# Patient Record
Sex: Female | Born: 2000 | Race: Black or African American | Hispanic: No | Marital: Single | Smoking: Never smoker
Health system: Southern US, Community
[De-identification: ages and names within clinical notes are randomized; demographics above are authoritative.]

## PROBLEM LIST (undated history)

## (undated) DIAGNOSIS — A1801 Tuberculosis of spine: Secondary | ICD-10-CM

## (undated) DIAGNOSIS — F419 Anxiety disorder, unspecified: Secondary | ICD-10-CM

## (undated) DIAGNOSIS — F32A Depression, unspecified: Secondary | ICD-10-CM

---

## 2021-06-04 ENCOUNTER — Encounter (HOSPITAL_COMMUNITY): Payer: Self-pay | Admitting: Emergency Medicine

## 2021-06-04 ENCOUNTER — Ambulatory Visit (HOSPITAL_COMMUNITY)
Admission: EM | Admit: 2021-06-04 | Discharge: 2021-06-04 | Disposition: A | Discharge status: Home / Self Care | Payer: BC Managed Care – PPO | Attending: Family Medicine | Admitting: Family Medicine

## 2021-06-04 ENCOUNTER — Other Ambulatory Visit: Payer: Self-pay

## 2021-06-04 DIAGNOSIS — J0391 Acute recurrent tonsillitis, unspecified: Secondary | ICD-10-CM | POA: Insufficient documentation

## 2021-06-04 DIAGNOSIS — Z113 Encounter for screening for infections with a predominantly sexual mode of transmission: Secondary | ICD-10-CM | POA: Diagnosis not present

## 2021-06-04 HISTORY — DX: Tuberculosis of spine: A18.01

## 2021-06-04 HISTORY — DX: Depression, unspecified: F32.A

## 2021-06-04 HISTORY — DX: Anxiety disorder, unspecified: F41.9

## 2021-06-04 LAB — POCT RAPID STREP A, ED / UC: Streptococcus, Group A Screen (Direct): NEGATIVE

## 2021-06-04 MED ORDER — PREDNISONE 20 MG PO TABS: 20.0000 mg | ORAL_TABLET | Freq: Every day | ORAL | 0 refills | Status: AC

## 2021-06-04 MED ORDER — LIDOCAINE HCL (PF) 1 % IJ SOLN
INTRAMUSCULAR | Status: AC
  Filled 2021-06-04: qty 2

## 2021-06-04 MED ORDER — CEFTRIAXONE SODIUM 500 MG IJ SOLR
INTRAMUSCULAR | Status: AC
  Filled 2021-06-04: qty 500

## 2021-06-04 MED ORDER — DOXYCYCLINE HYCLATE 100 MG PO CAPS: 100.0000 mg | ORAL_CAPSULE | Freq: Two times a day (BID) | ORAL | 0 refills | Status: AC

## 2021-06-04 MED ORDER — CEFTRIAXONE SODIUM 500 MG IJ SOLR
500.0000 mg | Freq: Once | INTRAMUSCULAR | Status: AC
  Administered 2021-06-04: 500 mg via INTRAMUSCULAR

## 2021-06-04 NOTE — ED Provider Notes (Signed)
[facility]    CSN: [id number] Arrival date & time: 06/04/21  1609      History   Chief Complaint No chief complaint on file.   HPI Tina Holt is a 20 y.o. female.   HPI Patient presents today for evaluation of recurrent tonsillitis.  She has been treated at her [facility] twice since August with amoxicillin.  She reports she has been tested for mono, rapid strep, and reports a different test however she cannot recall the name of the test.  She is sexually active including oral sex.  She reports this is never happened previously and she has no history of recurrent strep infections.  Endorses difficulty swallowing due to the enlargement of her tonsils.  She has had no fever.  Current symptoms have been present for greater then 2 weeks. Past Medical History:  Diagnosis Date   Anxiety and depression    Pott's disease     There are no problems to display for this patient.   History reviewed. No pertinent surgical history.  OB History   No obstetric history on file.      Home Medications    Prior to Admission medications   Medication Sig Start Date End Date Taking? Authorizing Provider  doxycycline (VIBRAMYCIN) 100 MG capsule Take 1 capsule (100 mg total) by mouth 2 (two) times daily for 7 days. 06/04/21 06/11/21 Yes Bing Neighbors, FNP  predniSONE (DELTASONE) 20 MG tablet Take 1 tablet (20 mg total) by mouth daily with breakfast for 5 days. 06/04/21 06/09/21 Yes Bing Neighbors, FNP    Family History No family history on file.  Social History Social History   Tobacco Use   Smoking status: Never   Smokeless tobacco: Never  Vaping Use   Vaping Use: Never used  Substance Use Topics   Alcohol use: Yes    Comment: rarely     Allergies   Patient has no known allergies.   Review of Systems Review of Systems Pertinent negatives listed in HPI   Physical Exam Triage Vital Signs ED Triage Vitals  Enc  Vitals Group     BP 06/04/21 1703 133/81     Pulse Rate 06/04/21 1703 (!) 58     Resp 06/04/21 1703 17     Temp 06/04/21 1703 97.9 F (36.6 C)     Temp Source 06/04/21 1703 Oral     SpO2 06/04/21 1703 99 %     Weight --      Height --      Head Circumference --      Peak Flow --      Pain Score 06/04/21 1708 0     Pain Loc --      Pain Edu? --      Excl. in GC? --    No data found.  Updated Vital Signs BP 126/70   Pulse 60   Temp 98.5 F (36.9 C)   Resp 18   LMP 05/21/2021   SpO2 100%   Visual Acuity Right Eye Distance:   Left Eye Distance:   Bilateral Distance:    Right Eye Near:   Left Eye Near:    Bilateral Near:     Physical Exam Constitutional:      General: She is not in acute distress.    Appearance: She is not ill-appearing.  HENT:     Head: Normocephalic and atraumatic.     Mouth/Throat:     Mouth: Mucous membranes are  moist.     Pharynx: Posterior oropharyngeal erythema and uvula swelling present.     Tonsils: Tonsillar exudate present. No tonsillar abscesses. 4+ on the right. 4+ on the left.  Eyes:     Pupils: Pupils are equal, round, and reactive to light.  Cardiovascular:     Rate and Rhythm: Normal rate.  Pulmonary:     Effort: Pulmonary effort is normal.     Breath sounds: Normal breath sounds.  Skin:    Capillary Refill: Capillary refill takes less than 2 seconds.  Neurological:     General: No focal deficit present.     Mental Status: She is alert and oriented to person, place, and time.  Psychiatric:        Mood and Affect: Mood normal.        Thought Content: Thought content normal.        Judgment: Judgment normal.     UC Treatments / Results  Labs (all labs ordered are listed, but only abnormal results are displayed) Labs Reviewed  CULTURE, GROUP A STREP [facility])  POCT RAPID STREP A, ED / UC  CYTOLOGY, (ORAL, ANAL, URETHRAL) ANCILLARY ONLY    EKG   Radiology No results found.  Procedures Procedures (including  critical care time)  Medications Ordered in UC Medications  cefTRIAXone (ROCEPHIN) injection 500 mg (500 mg Intramuscular Given 06/04/21 1828)    Initial Impression / Assessment and Plan / UC Course  I have reviewed the triage vital signs and the nursing notes.  Pertinent labs & imaging results that were available during my care of the patient were reviewed by me and considered in my medical decision making (see chart for details).    Rapid strep is negative Given appearance of tonsils and throat along with previous failure of amoxicillin I am treating and covering for possible GC chlamydia of the throat. Rocephin 500 given here in clinic.  Patient will continue treatment at home with doxycycline 500 mg twice daily for 7 days.  For tonsillar swelling prednisone 20 mg once daily with breakfast for 5 days.  Patient also given information to follow-up with ENT if her symptoms do not readily improve following 3 days of treatment. Patient verbalized understanding and agreement with plan. Final Clinical Impressions(s) / UC Diagnoses   Final diagnoses:  Recurrent tonsillitis     Discharge Instructions      You received a Rocephin injection today here in clinic this covers for STDs including gonorrhea and some complicated cases of acute tonsillitis.  We will also start you on doxycycline for treatment of chlamydia while awaiting your results of the throat swabs that I collected here in clinic today.  Also start prednisone 20 mg once daily with breakfast only take in the morning time as this can cause problems with sleep this will help reduce the enlargement and pain which is caused by inflammation of your tonsils.  In the meantime if your symptoms do not readily improve within 3 days please contact the ENT specialist listed on your discharge paperwork.     ED Prescriptions     Medication Sig Dispense Auth. Provider   doxycycline (VIBRAMYCIN) 100 MG capsule Take 1 capsule (100 mg total) by  mouth 2 (two) times daily for 7 days. 14 capsule Bing Neighbors, FNP   predniSONE (DELTASONE) 20 MG tablet Take 1 tablet (20 mg total) by mouth daily with breakfast for 5 days. 5 tablet Bing Neighbors, FNP      PDMP not reviewed this  encounter.   Bing Neighbors, FNP 06/04/21 Paulo Fruit

## 2021-06-04 NOTE — ED Triage Notes (Signed)
Pt is present today with tonsil discomfort. Pt states that she noticed white patches in her mouth.

## 2021-06-04 NOTE — Discharge Instructions (Addendum)
You received a Rocephin injection today here in clinic this covers for STDs including gonorrhea and some complicated cases of acute tonsillitis.  We will also start you on doxycycline for treatment of chlamydia while awaiting your results of the throat swabs that I collected here in clinic today.  Also start prednisone 20 mg once daily with breakfast only take in the morning time as this can cause problems with sleep this will help reduce the enlargement and pain which is caused by inflammation of your tonsils.  In the meantime if your symptoms do not readily improve within 3 days please contact the ENT specialist listed on your discharge paperwork.

## 2021-06-05 LAB — CYTOLOGY, (ORAL, ANAL, URETHRAL) ANCILLARY ONLY
Chlamydia: NEGATIVE
Comment: NEGATIVE
Comment: NEGATIVE
Comment: NORMAL
Neisseria Gonorrhea: NEGATIVE
Trichomonas: NEGATIVE

## 2021-06-07 LAB — CULTURE, GROUP A STREP (THRC)

## 2021-08-11 ENCOUNTER — Other Ambulatory Visit: Payer: Self-pay

## 2021-08-11 ENCOUNTER — Ambulatory Visit (HOSPITAL_COMMUNITY)
Admission: EM | Admit: 2021-08-11 | Discharge: 2021-08-11 | Disposition: A | Discharge status: Home / Self Care | Payer: BC Managed Care – PPO | Attending: Emergency Medicine | Admitting: Emergency Medicine

## 2021-08-11 ENCOUNTER — Encounter (HOSPITAL_COMMUNITY): Payer: Self-pay | Admitting: Emergency Medicine

## 2021-08-11 DIAGNOSIS — J040 Acute laryngitis: Secondary | ICD-10-CM | POA: Diagnosis not present

## 2021-08-11 DIAGNOSIS — R509 Fever, unspecified: Secondary | ICD-10-CM | POA: Diagnosis not present

## 2021-08-11 DIAGNOSIS — R590 Localized enlarged lymph nodes: Secondary | ICD-10-CM | POA: Insufficient documentation

## 2021-08-11 DIAGNOSIS — R10817 Generalized abdominal tenderness: Secondary | ICD-10-CM | POA: Diagnosis not present

## 2021-08-11 DIAGNOSIS — J029 Acute pharyngitis, unspecified: Secondary | ICD-10-CM | POA: Insufficient documentation

## 2021-08-11 DIAGNOSIS — R52 Pain, unspecified: Secondary | ICD-10-CM | POA: Insufficient documentation

## 2021-08-11 LAB — RESPIRATORY PANEL BY PCR

## 2021-08-11 LAB — POCT RAPID STREP A, ED / UC: Streptococcus, Group A Screen (Direct): NEGATIVE

## 2021-08-11 LAB — POCT INFECTIOUS MONO SCREEN, ED / UC: Mono Screen: NEGATIVE

## 2021-08-11 MED ORDER — PENICILLIN G BENZATHINE 1200000 UNIT/2ML IM SUSY
PREFILLED_SYRINGE | INTRAMUSCULAR | Status: AC
  Filled 2021-08-11: qty 2

## 2021-08-11 MED ORDER — PENICILLIN G BENZATHINE 1200000 UNIT/2ML IM SUSY
2.4000 10*6.[IU] | PREFILLED_SYRINGE | Freq: Once | INTRAMUSCULAR | Status: AC
  Administered 2021-08-11: 2.4 10*6.[IU] via INTRAMUSCULAR

## 2021-08-11 MED ORDER — FLUCONAZOLE 150 MG PO TABS: ORAL_TABLET | ORAL | 0 refills | Status: AC

## 2021-08-11 MED ORDER — IBUPROFEN 800 MG PO TABS
800.0000 mg | ORAL_TABLET | Freq: Once | ORAL | Status: AC
  Administered 2021-08-11: 800 mg via ORAL

## 2021-08-11 MED ORDER — ONDANSETRON 8 MG PO TBDP: 8.0000 mg | ORAL_TABLET | Freq: Three times a day (TID) | ORAL | 1 refills | Status: AC | PRN

## 2021-08-11 MED ORDER — LIDOCAINE VISCOUS HCL 2 % MT SOLN
OROMUCOSAL | Status: AC
  Filled 2021-08-11: qty 15

## 2021-08-11 MED ORDER — LIDOCAINE VISCOUS HCL 2 % MT SOLN: 15.0000 mL | OROMUCOSAL | 0 refills | Status: AC | PRN

## 2021-08-11 MED ORDER — LIDOCAINE VISCOUS HCL 2 % MT SOLN
15.0000 mL | Freq: Once | OROMUCOSAL | Status: AC
  Administered 2021-08-11: 15 mL via OROMUCOSAL

## 2021-08-11 MED ORDER — AMOXICILLIN-POT CLAVULANATE 875-125 MG PO TABS
1.0000 | ORAL_TABLET | Freq: Two times a day (BID) | ORAL | 0 refills | Status: AC
Start: 2021-08-11 — End: 2021-08-25

## 2021-08-11 MED ORDER — IBUPROFEN 800 MG PO TABS
ORAL_TABLET | ORAL | Status: AC
  Filled 2021-08-11: qty 1

## 2021-08-11 MED ORDER — CLINDAMYCIN HCL 150 MG PO CAPS: 450.0000 mg | ORAL_CAPSULE | Freq: Three times a day (TID) | ORAL | 0 refills | Status: AC

## 2021-08-11 MED ORDER — METHYLPREDNISOLONE 4 MG PO TBPK: ORAL_TABLET | ORAL | 0 refills | Status: AC

## 2021-08-11 MED ORDER — LOPERAMIDE HCL 2 MG PO TABS: 4.0000 mg | ORAL_TABLET | Freq: Four times a day (QID) | ORAL | 1 refills | Status: AC | PRN

## 2021-08-11 NOTE — ED Triage Notes (Signed)
Pt held up her phone with message typed out that states had sore throat and swollen tonsils for over month and now not able to swallow or speak. Feels dizzy because not able to eat and drink. Reports has been seen before and given medications without relief.

## 2021-08-11 NOTE — ED Provider Notes (Signed)
[facility]    CSN: [id number] Arrival date & time: 08/11/21  1011    HISTORY   Chief Complaint  Patient presents with   Sore Throat   HPI Tina Holt is a 21 y.o. female. Patient presents to the urgent care today with third occurrence of acute tonsillitis and pharyngitis since September 2022.  Patient states she also now has laryngitis as well.  Patient was initially seen in September, treated with amoxicillin and had some improvement of symptoms however symptoms returned about a week after she finished treatment.  Patient was seen again at urgent care in November, at that time she was provided with an injection of ceftriaxone and a prescription for doxycycline 100 mg twice daily for 7 days.  Patient was tested during that visit for mono, strep, and oral cytology was performed to evaluate for STD, all results were negative.  Patient states that after treatment November again she had relief for about 2 weeks, then slowly her symptoms began to return again.  Patient is tearful during visit today, states she is very frustrated she does not know what else to do to feel better.  Patient denies travel out of the country, known immune compromise status, known sick contacts.  Patient states she does engage in oral sex and has done so since she was tested in November.  Patient states she continues to have body ache, fever, chills.  Patient has a slightly elevated temperature on arrival today.  Patient denies cough, nasal congestion, runny nose, nausea, vomiting, diarrhea, headache.  Patient has a bottle with her today, it is nearly full of clear liquid, when asked what she is drinking patient states that is the saliva she has to spit out because she is unable to swallow it without significant amount of pain.  Patient denies difficulty maintaining airway.  Patient's oxygenation status is 98%.  Patient is able to whisper quietly but avoids using her voice is much as possible.  The  history is provided by the patient.  Past Medical History:  Diagnosis Date   Anxiety and depression    Pott's disease    There are no problems to display for this patient.  History reviewed. No pertinent surgical history. OB History   No obstetric history on file.    Home Medications    Prior to Admission medications   Not on File   Family History No family history on file. Social History Social History   Tobacco Use   Smoking status: Never   Smokeless tobacco: Never  Vaping Use   Vaping Use: Never used  Substance Use Topics   Alcohol use: Yes    Comment: rarely   Allergies   Patient has no known allergies.  Review of Systems Review of Systems Pertinent findings noted in history of present illness.   Physical Exam Triage Vital Signs ED Triage Vitals  Enc Vitals Group     BP 05/17/21 0827 (!) 147/82     Pulse Rate 05/17/21 0827 72     Resp 05/17/21 0827 18     Temp 05/17/21 0827 98.3 F (36.8 C)     Temp Source 05/17/21 0827 Oral     SpO2 05/17/21 0827 98 %     Weight --      Height --      Head Circumference --      Peak Flow --      Pain Score 05/17/21 0826 5     Pain Loc --  Pain Edu? --      Excl. in GC? --   No data found.  Updated Vital Signs BP 109/72 (BP Location: Right Arm)    Pulse 94    Temp 99.1 F (37.3 C) (Oral)    Resp 16    SpO2 98%   Physical Exam Constitutional:      General: She is not in acute distress.    Appearance: She is well-developed. She is ill-appearing. She is not toxic-appearing.  HENT:     Head: Normocephalic and atraumatic.     Salivary Glands: Right salivary gland is diffusely enlarged (Right greater than left) and tender (Right greater than left). Left salivary gland is diffusely enlarged and tender.     Right Ear: Hearing, tympanic membrane, ear canal and external ear normal.     Left Ear: Hearing, tympanic membrane, ear canal and external ear normal.     Ears:     Comments: Bilateral EACs with mild  erythema, bilateral TMs are normal    Nose: Mucosal edema (Right nare) present. No nasal deformity, septal deviation, signs of injury, laceration, nasal tenderness, congestion or rhinorrhea.     Right Nostril: No foreign body, epistaxis, septal hematoma or occlusion.     Left Nostril: No foreign body, epistaxis, septal hematoma or occlusion.     Right Turbinates: Not enlarged, swollen or pale.     Left Turbinates: Not enlarged or swollen.     Right Sinus: No maxillary sinus tenderness or frontal sinus tenderness.     Left Sinus: No maxillary sinus tenderness or frontal sinus tenderness.     Mouth/Throat:     Lips: Pink. No lesions.     Mouth: Mucous membranes are moist. No oral lesions or angioedema.     Dentition: No gingival swelling.     Tongue: No lesions.     Palate: No mass.     Pharynx: Uvula midline. Pharyngeal swelling, oropharyngeal exudate and posterior oropharyngeal erythema present. No uvula swelling.     Tonsils: Tonsillar exudate (Slightly gray on posterior aspect of right tonsil, left tonsil is so large I am unable to see the posterior aspect.) and tonsillar abscess (Left) present. 3+ on the right. 4+ on the left.     Comments: Patient complains of a burning sensation on the right side of her tongue, I see no evidence of lesion.. Eyes:     General: Lids are normal. Lids are everted, no foreign bodies appreciated. Vision grossly intact.     Extraocular Movements: Extraocular movements intact.     Conjunctiva/sclera: Conjunctivae normal.     Right eye: Right conjunctiva is not injected. No exudate.    Left eye: Left conjunctiva is not injected. No exudate.    Pupils: Pupils are equal, round, and reactive to light.     Comments: No pallor appreciated  Neck:     Thyroid: No thyroid mass, thyromegaly or thyroid tenderness.     Trachea: No tracheal tenderness, abnormal tracheal secretions or tracheal deviation.     Comments: Voice is muffled, patient whispering Cardiovascular:      Rate and Rhythm: Normal rate and regular rhythm.     Pulses: Normal pulses.     Heart sounds: Normal heart sounds, S1 normal and S2 normal. No murmur heard.   No friction rub. No gallop.  Pulmonary:     Effort: Pulmonary effort is normal. No accessory muscle usage, prolonged expiration, respiratory distress or retractions.     Breath sounds: No stridor, decreased air movement  or transmitted upper airway sounds. No decreased breath sounds, wheezing, rhonchi or rales.  Abdominal:     General: Bowel sounds are normal.     Palpations: Abdomen is soft.     Tenderness: There is generalized abdominal tenderness. There is no right CVA tenderness, left CVA tenderness or rebound. Negative signs include Murphy's sign.     Hernia: No hernia is present.  Musculoskeletal:        General: No tenderness. Normal range of motion.     Cervical back: Full passive range of motion without pain, normal range of motion and neck supple.     Right lower leg: No edema.     Left lower leg: No edema.  Lymphadenopathy:     Cervical: Cervical adenopathy present.     Right cervical: Superficial cervical adenopathy, deep cervical adenopathy and posterior cervical adenopathy present.     Left cervical: Superficial cervical adenopathy, deep cervical adenopathy and posterior cervical adenopathy present.  Skin:    General: Skin is warm and dry.     Findings: No erythema, lesion or rash.  Neurological:     General: No focal deficit present.     Mental Status: She is alert and oriented to person, place, and time. Mental status is at baseline.  Psychiatric:        Mood and Affect: Mood normal.        Behavior: Behavior normal.        Thought Content: Thought content normal.        Judgment: Judgment normal.    Visual Acuity Right Eye Distance:   Left Eye Distance:   Bilateral Distance:    Right Eye Near:   Left Eye Near:    Bilateral Near:     UC Couse / Diagnostics / Procedures:    EKG  Radiology No  results found.  Procedures Procedures (including critical care time)  UC Diagnoses / Final Clinical Impressions(s)   I have reviewed the triage vital signs and the nursing notes.  Pertinent labs & imaging results that were available during my care of the patient were reviewed by me and considered in my medical decision making (see chart for details).   Final diagnoses:  Acute pharyngitis, unspecified etiology   Differential diagnoses include staph infection throat which may have been inadequately treated with only 7 days of doxycycline, immune compromise, diphtheria.  Patient states she is vaccinated, last Tdap booster was likely at age 72.  We will treat patient empirically for methicillin-resistant Staph aureus infection in throat.  Patient provided with injection of Bicillin during visit to cover any other atypical gram-negative rods and prescriptions for amoxicillin clavulanate and clindamycin 450 mg for 14-day treatment.  Patient also provided with Medrol Dosepak to reduce swelling in throat.  Patient provided with a prescription for lidocaine, loperamide, Diflucan, Zofran as well for management of side effects of aggressive antibiotic therapy.  Patient advised to return in 3 to 5 days if she is not having any meaningful improvement.  Patient vies to go the emergency room if symptoms worsen despite antibiotic therapy. Patient was tested for respiratory infection, mono and strep today, mono and strep test were negative, throat culture is pending.  Patient advised we will notify her of the results of her respiratory panel and throat culture once they are complete.  ED Prescriptions     Medication Sig Dispense Auth. Provider   amoxicillin-clavulanate (AUGMENTIN) 875-125 MG tablet Take 1 tablet by mouth every 12 (twelve) hours for 14 days. 28  tablet Theadora Rama Scales, PA-C   clindamycin (CLEOCIN) 150 MG capsule Take 3 capsules (450 mg total) by mouth 3 (three) times daily for 14 days. 126  capsule Theadora Rama Scales, PA-C   fluconazole (DIFLUCAN) 150 MG tablet Take first tablet today.  Take second tablet three days after first tablet. Take third tablet three days after second tablet. 3 tablet Theadora Rama Scales, PA-C   loperamide (IMODIUM A-D) 2 MG tablet Take 2 tablets (4 mg total) by mouth 4 (four) times daily as needed for up to 14 days for diarrhea or loose stools. 30 tablet Theadora Rama Scales, PA-C   ondansetron (ZOFRAN-ODT) 8 MG disintegrating tablet Take 1 tablet (8 mg total) by mouth every 8 (eight) hours as needed for nausea or vomiting. 20 tablet Theadora Rama Scales, PA-C   methylPREDNISolone (MEDROL DOSEPAK) 4 MG TBPK tablet Take 24 mg on day 1, 20 mg on day 2, 16 mg on day 3, 12 mg on day 4, 8 mg on day 5, 4 mg on day 6. 21 tablet Theadora Rama Scales, PA-C   lidocaine (XYLOCAINE) 2 % solution Use as directed 15 mLs in the mouth or throat every 3 (three) hours as needed for mouth pain (Sore throat). 300 mL Theadora Rama Scales, PA-C      PDMP not reviewed this encounter.  Pending results:  Labs Reviewed  RESPIRATORY PANEL BY PCR  CULTURE, GROUP A STREP [facility])  POCT RAPID STREP A, ED / UC  POCT INFECTIOUS MONO SCREEN, ED / UC    Medications Ordered in UC: Medications  ibuprofen (ADVIL) tablet 800 mg (800 mg Oral Given 08/11/21 1159)  lidocaine (XYLOCAINE) 2 % viscous mouth solution 15 mL (15 mLs Mouth/Throat Given 08/11/21 1200)  penicillin g benzathine (BICILLIN LA) 1200000 UNIT/2ML injection 2.4 Million Units (2.4 Million Units Intramuscular Given 08/11/21 1229)    Disposition Upon Discharge:  Condition: stable for discharge home Home: take medications as prescribed; routine discharge instructions as discussed; follow up as advised.  Patient presented with an acute illness with associated systemic symptoms and significant discomfort requiring urgent management. In my opinion, this is a condition that a prudent lay person (someone who possesses  an average knowledge of health and medicine) may potentially expect to result in complications if not addressed urgently such as respiratory distress, impairment of bodily function or dysfunction of bodily organs.   Routine symptom specific, illness specific and/or disease specific instructions were discussed with the patient and/or caregiver at length.   As such, the patient has been evaluated and assessed, work-up was performed and treatment was provided in alignment with urgent care protocols and evidence based medicine.  Patient/parent/caregiver has been advised that the patient may require follow up for further testing and treatment if the symptoms continue in spite of treatment, as clinically indicated and appropriate.  If the patient was tested for COVID-19, Influenza and/or RSV, then the patient/parent/guardian was advised to isolate at home pending the results of his/her diagnostic coronavirus test and potentially longer if theyre positive. I have also advised pt that if his/her COVID-19 test returns positive, it's recommended to self-isolate for at least 10 days after symptoms first appeared AND until fever-free for 24 hours without fever reducer AND other symptoms have improved or resolved. Discussed self-isolation recommendations as well as instructions for household member/close contacts as per the [facility] and  [facility], and also gave patient the COVID packet with this information.  Patient/parent/caregiver has been advised to return to the [facility] or PCP in  3-5 days if no better; to PCP or the Emergency Department if new signs and symptoms develop, or if the current signs or symptoms continue to change or worsen for further workup, evaluation and treatment as clinically indicated and appropriate  The patient will follow up with their current PCP if and as advised. If the patient does not currently have a PCP we will assist them in obtaining one.   The patient may need specialty follow up if the  symptoms continue, in spite of conservative treatment and management, for further workup, evaluation, consultation and treatment as clinically indicated and appropriate.  Patient/parent/caregiver verbalized understanding and agreement of plan as discussed.  All questions were addressed during visit.  Please see discharge instructions below for further details of plan.  Discharge Instructions:   Discharge Instructions      The testing we performed in the clinic today included a monotest, rapid strep test and a respiratory panel.  Your mono and strep tests were both negative.  We will be performing throat culture per our protocol but I do not expect that it will yield a positive strep result given negative strep tests in the past.  The respiratory pathogen panel also takes a few days to come back, you will be notified of those results via your MyChart and if there are any positive findings, we will contact you by phone.  The most likely cause of your infection today is a staph infection.  Staph is resistant to amoxicillin and and a little bit resistant doxycycline, particularly since she only gave you 7 days of doxycycline.  It is possible that you were just simply undertreated.  For this reason, I recommend a very aggressive antibiotic regimen which includes an injection of Bicillin, which she received in the office today.  I recommend you also take Augmentin 1 tablet twice daily for 14 days and clindamycin, 3 capsules 4 times daily for 14 days.  For yeast infection, which is unavailable, please take fluconazole as prescribed.  For diarrhea, you can take loperamide up to 4 times daily as needed for runny stools.  Please be sure you are also drinking plenty of Gatorade, Pedialyte or Powerade to make sure that your electrolytes are replaced and that you stay well-hydrated.  For nausea, if any, you can take Zofran 1 tablet up to 3 times daily as needed.  Please avoid dairy products while taking  antibiotics as they can often become bound to the calcium and dairy making them more difficult to absorb and therefore ineffective.  Dairy products may also thicken your secretions, make it more difficult for you to swallow and keep your airway clear.  I have also provided you with a prescription for Medrol Dosepak, please take 1 row of tablets daily for the next 6 days.  In the meantime, if you experience any increased pain in your throat, increased fever, difficulty maintaining your airway meaning you find it difficult to breathe because you are throat is swollen, please report to the emergency room immediately or call 911.  I believe that the neck step is definitely going to be considering having your tonsils removed.  This is done by a specialist called an otolaryngologist, more commonly called ENT.  If you need to be seen next week because you are not feeling better or if you have any further questions or concerns, I am [facility], this is at [address]  Thank you for visiting urgent care  today.  I appreciate the opportunity to participate in your care.      This office note has been dictated using Teaching laboratory technician.  Unfortunately, and despite my best efforts, this method of dictation can sometimes lead to occasional typographical or grammatical errors.  I apologize in advance if this occurs.     Theadora Rama Scales, PA-C 08/11/21 1333

## 2021-08-11 NOTE — Discharge Instructions (Addendum)
The testing we performed in the clinic today included a monotest, rapid strep test and a respiratory panel.  Your mono and strep tests were both negative.  We will be performing throat culture per our protocol but I do not expect that it will yield a positive strep result given negative strep tests in the past.  The respiratory pathogen panel also takes a few days to come back, you will be notified of those results via your MyChart and if there are any positive findings, we will contact you by phone.  The most likely cause of your infection today is a staph infection.  Staph is resistant to amoxicillin and and a little bit resistant doxycycline, particularly since she only gave you 7 days of doxycycline.  It is possible that you were just simply undertreated.  For this reason, I recommend a very aggressive antibiotic regimen which includes an injection of Bicillin, which she received in the office today.  I recommend you also take Augmentin 1 tablet twice daily for 14 days and clindamycin, 3 capsules 4 times daily for 14 days.  For yeast infection, which is unavailable, please take fluconazole as prescribed.  For diarrhea, you can take loperamide up to 4 times daily as needed for runny stools.  Please be sure you are also drinking plenty of Gatorade, Pedialyte or Powerade to make sure that your electrolytes are replaced and that you stay well-hydrated.  For nausea, if any, you can take Zofran 1 tablet up to 3 times daily as needed.  Please avoid dairy products while taking antibiotics as they can often become bound to the calcium and dairy making them more difficult to absorb and therefore ineffective.  Dairy products may also thicken your secretions, make it more difficult for you to swallow and keep your airway clear.  I have also provided you with a prescription for Medrol Dosepak, please take 1 row of tablets daily for the next 6 days.  In the meantime, if you experience any increased pain in  your throat, increased fever, difficulty maintaining your airway meaning you find it difficult to breathe because you are throat is swollen, please report to the emergency room immediately or call 911.  I believe that the neck step is definitely going to be considering having your tonsils removed.  This is done by a specialist called an otolaryngologist, more commonly called ENT.  If you need to be seen next week because you are not feeling better or if you have any further questions or concerns, I am happy to see you at the [facility] commons location, this is at [address]  Thank you for visiting urgent care today.  I appreciate the opportunity to participate in your care.

## 2021-08-12 ENCOUNTER — Encounter (HOSPITAL_COMMUNITY): Payer: Self-pay

## 2021-08-12 ENCOUNTER — Other Ambulatory Visit: Payer: Self-pay

## 2021-08-12 ENCOUNTER — Emergency Department (HOSPITAL_COMMUNITY): Payer: BC Managed Care – PPO

## 2021-08-12 ENCOUNTER — Emergency Department (HOSPITAL_COMMUNITY)
Admission: EM | Admit: 2021-08-12 | Discharge: 2021-08-12 | Disposition: A | Discharge status: Home / Self Care | Payer: BC Managed Care – PPO | Attending: Emergency Medicine | Admitting: Emergency Medicine

## 2021-08-12 DIAGNOSIS — J36 Peritonsillar abscess: Secondary | ICD-10-CM | POA: Insufficient documentation

## 2021-08-12 DIAGNOSIS — J029 Acute pharyngitis, unspecified: Secondary | ICD-10-CM

## 2021-08-12 LAB — BASIC METABOLIC PANEL
Anion gap: 11 (ref 5–15)
BUN: 9 mg/dL (ref 6–20)
CO2: 20 mmol/L — ABNORMAL LOW (ref 22–32)
Calcium: 9.1 mg/dL (ref 8.9–10.3)
Chloride: 103 mmol/L (ref 98–111)
Creatinine, Ser: 0.71 mg/dL (ref 0.44–1.00)
GFR, Estimated: >60 mL/min (ref >60)
Glucose, Bld: 86 mg/dL (ref 70–99)
Potassium: 3.7 mmol/L (ref 3.5–5.1)
Sodium: 134 mmol/L — ABNORMAL LOW (ref 135–145)

## 2021-08-12 LAB — CBC
HCT: 35 % — ABNORMAL LOW (ref 36.0–46.0)
Hemoglobin: 11.5 g/dL — ABNORMAL LOW (ref 12.0–15.0)
MCH: 31.1 pg (ref 26.0–34.0)
MCHC: 32.9 g/dL (ref 30.0–36.0)
MCV: 94.6 fL (ref 80.0–100.0)
Platelets: 298 10*3/uL (ref 150–400)
RBC: 3.7 MIL/uL — ABNORMAL LOW (ref 3.87–5.11)
RDW: 12.2 % (ref 11.5–15.5)
WBC: 16.3 10*3/uL — ABNORMAL HIGH (ref 4.0–10.5)
nRBC: 0 % (ref 0.0–0.2)

## 2021-08-12 LAB — CULTURE, GROUP A STREP (THRC)

## 2021-08-12 MED ORDER — DEXAMETHASONE SODIUM PHOSPHATE 10 MG/ML IJ SOLN
10.0000 mg | Freq: Once | INTRAMUSCULAR | Status: AC
  Administered 2021-08-12: 10 mg via INTRAVENOUS
  Filled 2021-08-12: qty 1

## 2021-08-12 MED ORDER — IOHEXOL 300 MG/ML  SOLN
75.0000 mL | Freq: Once | INTRAMUSCULAR | Status: AC | PRN
  Administered 2021-08-12: 75 mL via INTRAVENOUS

## 2021-08-12 NOTE — Discharge Instructions (Addendum)
As we discussed, you work-up in the ER this evening revealed that you have a small developing abscess behind your right tonsil. I have discussed this with ENT and would like for you to take your already prescribed Augmentin twice a day for the next 10 days (not 14). Please discontinue all other prescribed medications. I have also given you a referral to ENT, please call them tomorrow to schedule an appointment.   Return if development of any new or worsening symptoms

## 2021-08-12 NOTE — ED Triage Notes (Signed)
Pt reports sore throat and swollen tonsils since September. She reports she has been on multiple different antibiotics and steroids and has had no relief. Pt was seen at [facility] yesterday for same and given antibiotics but she reports being unable to swallow the pills.

## 2021-08-12 NOTE — ED Provider Notes (Signed)
[facility]-EMERGENCY DEPT Provider Note   CSN: [id number] Arrival date & time: 08/12/21  1725     History  Chief Complaint  Patient presents with   Sore Throat    Tina Holt is a 21 y.o. female.  Patient with no pertinent past medical history presents today with complaint of sore throat. She states that this has been going on since September. She states that she was initially seen by urgent care in September and treated with amoxicillin with improvement in her symptoms which unfortunately return 1 week after she completed antibiotics. She states that her symptoms persisted until she again went to Urgent Care in November, she was then given IM Rocephin and doxycycline. During that visit she was tested for mono, strep, and oral cytology to evaluate for STD, all results were negative.  Patient states that after treatment in November again she had relief for about 2 weeks, then slowly her symptoms began to return. She does state that at some point her sister also had symptoms, however hers resolved on their own without intervention. Patient states she does engage in oral sex and has done so since she was tested in November. Patient denies cough, nasal congestion, runny nose, nausea, vomiting, diarrhea, headache.  She states that since Friday she has had significant difficulty tolerating her own secretions and has been spitting into a bag and only eating some watermelon and ice cream. She also states that she has been avoiding talking and communicates by writing on paper during my encounter. She denies any difficulty breathing, does endorse right sided neck pain.   Patient denies difficulty maintaining airway.   She was seen at Urgent Care yesterday who tested for mono, strep, and performed a respiratory panel. All of which were negative, culture grew non group A strep. She was then given IM Bicillin in the office and sent home with Augmentin, Clindamycin,  fluconazole, medrol dosepak, lidocaine, loperamide, and Zofran. She has been unable to swallow these medications.   The history is provided by the patient. No language interpreter was used.  Sore Throat Pertinent negatives include no shortness of breath.      Home Medications Prior to Admission medications   Medication Sig Start Date End Date Taking? Authorizing Provider  amoxicillin-clavulanate (AUGMENTIN) 875-125 MG tablet Take 1 tablet by mouth every 12 (twelve) hours for 14 days. 08/11/21 08/25/21  Theadora Rama Scales, PA-C  clindamycin (CLEOCIN) 150 MG capsule Take 3 capsules (450 mg total) by mouth 3 (three) times daily for 14 days. 08/11/21 08/25/21  Theadora Rama Scales, PA-C  fluconazole (DIFLUCAN) 150 MG tablet Take first tablet today.  Take second tablet three days after first tablet. Take third tablet three days after second tablet. 08/11/21   Theadora Rama Scales, PA-C  lidocaine (XYLOCAINE) 2 % solution Use as directed 15 mLs in the mouth or throat every 3 (three) hours as needed for mouth pain (Sore throat). 08/11/21   Theadora Rama Scales, PA-C  loperamide (IMODIUM A-D) 2 MG tablet Take 2 tablets (4 mg total) by mouth 4 (four) times daily as needed for up to 14 days for diarrhea or loose stools. 08/11/21 08/25/21  Theadora Rama Scales, PA-C  methylPREDNISolone (MEDROL DOSEPAK) 4 MG TBPK tablet Take 24 mg on day 1, 20 mg on day 2, 16 mg on day 3, 12 mg on day 4, 8 mg on day 5, 4 mg on day 6. 08/11/21   Theadora Rama Scales, PA-C  ondansetron (ZOFRAN-ODT) 8 MG disintegrating tablet  Take 1 tablet (8 mg total) by mouth every 8 (eight) hours as needed for nausea or vomiting. 08/11/21   Theadora RamaMorgan, [name], PA-C      Allergies    Patient has no known allergies.    Review of Systems   Review of Systems  Constitutional:  Negative for chills and fever.  HENT:  Positive for sore throat, trouble swallowing and voice change. Negative for congestion and rhinorrhea.   Respiratory:   Negative for cough and shortness of breath.   Gastrointestinal:  Negative for diarrhea, nausea and vomiting.  All other systems reviewed and are negative.  Physical Exam Updated Vital Signs BP (!) 140/93 (BP Location: Right Arm)    Pulse 75    Temp 98.9 F (37.2 C) (Oral)    Resp 16    SpO2 95%  Physical Exam Vitals and nursing note reviewed.  Constitutional:      General: She is not in acute distress.    Appearance: She is well-developed. She is not ill-appearing, toxic-appearing or diaphoretic.     Comments: Patient sitting comfortably in chair in no acute distress  HENT:     Head: Normocephalic and atraumatic.     Mouth/Throat:     Mouth: Mucous membranes are moist.     Pharynx: Uvula midline. Pharyngeal swelling and posterior oropharyngeal erythema present. No uvula swelling.     Tonsils: 4+ on the right. 4+ on the left.  Eyes:     Conjunctiva/sclera: Conjunctivae normal.     Pupils: Pupils are equal, round, and reactive to light.  Cardiovascular:     Rate and Rhythm: Normal rate.  Pulmonary:     Effort: Pulmonary effort is normal. No respiratory distress.     Breath sounds: Normal breath sounds. No stridor. No wheezing, rhonchi or rales.  Abdominal:     Palpations: Abdomen is soft.  Musculoskeletal:     Cervical back: Normal range of motion.  Skin:    General: Skin is warm and dry.  Neurological:     General: No focal deficit present.     Mental Status: She is alert.  Psychiatric:        Mood and Affect: Mood normal.        Behavior: Behavior normal.    ED Results / Procedures / Treatments   Labs (all labs ordered are listed, but only abnormal results are displayed) Labs Reviewed  CBC - Abnormal; Notable for the following components:      Result Value   WBC 16.3 (*)    RBC 3.70 (*)    Hemoglobin 11.5 (*)    HCT 35.0 (*)    All other components within normal limits  BASIC METABOLIC PANEL - Abnormal; Notable for the following components:   Sodium 134 (*)     CO2 20 (*)    All other components within normal limits    EKG None  Radiology CT Soft Tissue Neck W Contrast  Result Date: 08/12/2021 CLINICAL DATA:  Epiglottitis or tonsillitis suspected, difficult to swallow EXAM: CT NECK WITH CONTRAST TECHNIQUE: Multidetector CT imaging of the neck was performed using the standard protocol following the bolus administration of intravenous contrast. RADIATION DOSE REDUCTION: This exam was performed according to the departmental dose-optimization program which includes automated exposure control, adjustment of the mA and/or kV according to patient size and/or use of iterative reconstruction technique. CONTRAST:  75mL OMNIPAQUE IOHEXOL 300 MG/ML  SOLN COMPARISON:  None. FINDINGS: Pharynx and larynx: Enlargement of the right-greater-than-left palatine tonsil, with  a focal area of low-density measuring 1.4 x 0.7 x 1.2 cm (series 2, image 20 and series 6, image 52) in the lateral aspect of the right tonsil,, likely phlegmon or developing abscess. Additional less well-defined low-density area in the more medial aspect of the tonsil (series 2, image 20 and series 6, image 52), may also represent developing phlegmon. The pharynx and larynx are otherwise unremarkable. Salivary glands: No inflammation, mass, or stone. Thyroid: Normal. Lymph nodes: Enlarged right level 2 lymph node measures up to 1.4 cm in short axis (series 2, image 20), likely reactive. Additional prominent right-greater-than-left level 2 lymph nodes, also likely reactive. Vascular: Negative. Limited intracranial: Negative. Visualized orbits: Negative. Mastoids and visualized paranasal sinuses: Clear. Skeleton: No acute or aggressive process. Upper chest: Negative. Other: None. IMPRESSION: 1. Enlargement of the right-greater-than-left palatine tonsils, with a focal area of low density, measuring up to 1.4 cm, concerning for phlegmon or developing abscess. An additional more medial area of less well-defined  hypodensity may represent developing phlegmon. 2. Enlarged right-greater-than-left level 2 lymph nodes, likely reactive. These results were called by telephone at the time of interpretation on 08/12/2021 at 8:29 pm to provider [facility] , who verbally acknowledged these results. Electronically Signed   By: Wiliam Ke M.D.   On: 08/12/2021 20:33    Procedures Procedures    Medications Ordered in ED Medications  dexamethasone (DECADRON) injection 10 mg (has no administration in time range)    ED Course/ Medical Decision Making/ A&P                           Medical Decision Making Amount and/or Complexity of Data Reviewed Labs: ordered. Radiology: ordered.  Risk Prescription drug management.   This patient presents to the ED for concern of sore throat, this involves an extensive number of treatment options, and is a complaint that carries with it a high risk of complications and morbidity.  The differential diagnosis includes RPA, PTA    Additional history obtained:  External records from outside source obtained and reviewed including urgent care note   Lab Tests:  I Ordered, and personally interpreted labs.  The pertinent results include:  WBC 16.3   Imaging Studies ordered:  I ordered imaging studies including CT soft tissue neck with contrast  I independently visualized and interpreted imaging which showed  Enlargement of the right-greater-than-left palatine tonsils, with a focal area of low density, measuring up to 1.4 cm, concerning for phlegmon or developing abscess. An additional more medial area of less well-defined hypodensity may represent developing phlegmon. Enlarged right-greater-than-left level 2 lymph nodes, likely reactive. I agree with the radiologist interpretation    Medicines ordered and prescription drug management:  I ordered medication including decadron  for tonsillar swelling Reevaluation of the patient after these medicines showed that  the patient improved I have reviewed the patients home medicines and have made adjustments as needed   Consultations Obtained:  I requested consultation with the on call ENT,  and discussed lab and imaging findings as well as pertinent plan - they recommend: outpatient management with Augmentin, ENT referral, close return precautions    Reevaluation:  After the interventions noted above, I reevaluated the patient and found that they have :improved   Dispostion:  After consideration of the diagnostic results and the patients response to treatment, I feel that the patent would benefit from outpatient management with antibiotics, ENT referral, and close return precautions.  Patient presents today  with sore throat for several months, difficulty tolerating her own secretions since Friday. CT obtained which revealed developing abscess vs phlegmon.   Patient reassessed after decadron administration, she is now able to talk and drink water without difficulty.  Discussed patient with ENT Dr. Jearld FentonByers who states that patient should be good for discharge with Augmentin. Patient is afebrile, non-toxic appearing, and in no acute distress with reassuring vital signs. Stable for discharge at this time. Educated her to d/c all previous prescribed medications and to take only previously prescribed Augmentin. Educated on red flag symptoms that would prompt immediate return. Discharged in stable condition.  Findings and plan of care discussed with supervising physician Dr. Audley HoseHong who is in agreement.    Final Clinical Impression(s) / ED Diagnoses Final diagnoses:  Sore throat  Tonsillar abscess    Rx / DC Orders ED Discharge Orders     None     An After Visit Summary was printed and given to the patient.     Vear ClockSmoot, Tacari Repass A, PA-C 08/12/21 2157    Cheryll CockayneHong, Joshua S, MD 08/14/21 [phone]

## 2023-01-03 IMAGING — CT CT NECK W/ CM
3 of 4 series · 12 of 33 positions shown, 14 images · IV contrast (agent unspecified)
Comparison: None.

CLINICAL DATA: Epiglottitis or tonsillitis suspected, difficult to
swallow

EXAM:
CT NECK WITH CONTRAST
TECHNIQUE: Multidetector CT imaging of the neck was performed using the
standard protocol following the bolus administration of intravenous
contrast.

[Series 5: axial · axial · 0.39mm/px · z∈[+1208,+1378]mm · 4 of 141 slices shown, 5 images]
[im 21/141  soft-tissue]
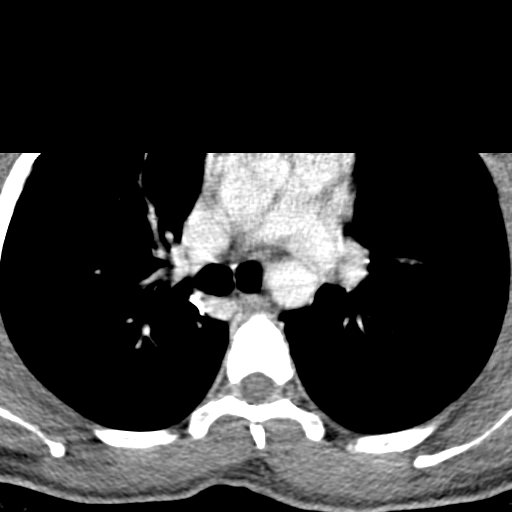
[im 21/141  bone]
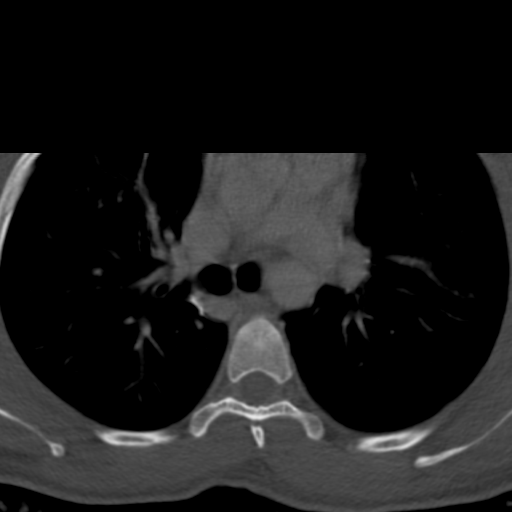
[im 61/141  bone]
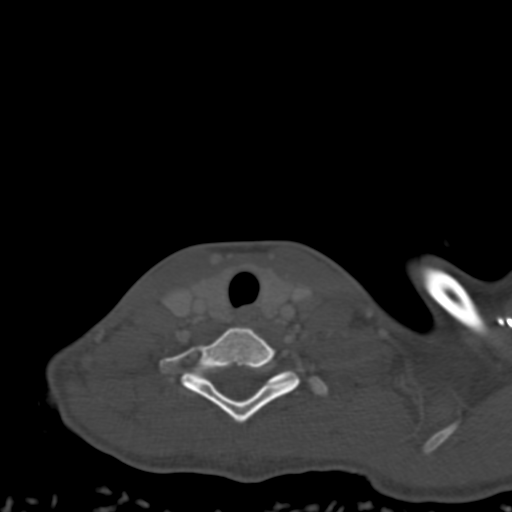
[im 81/141  bone]
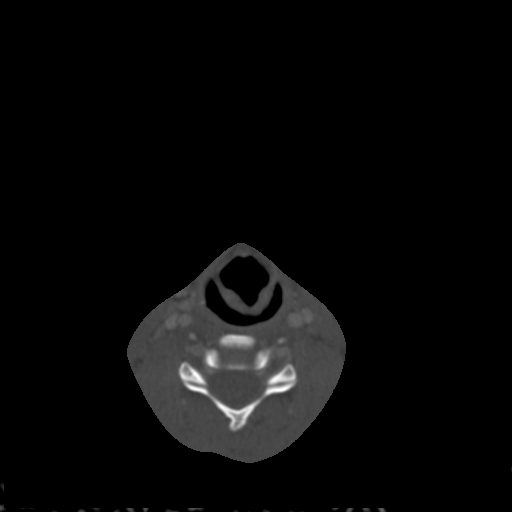
[im 121/141  bone]
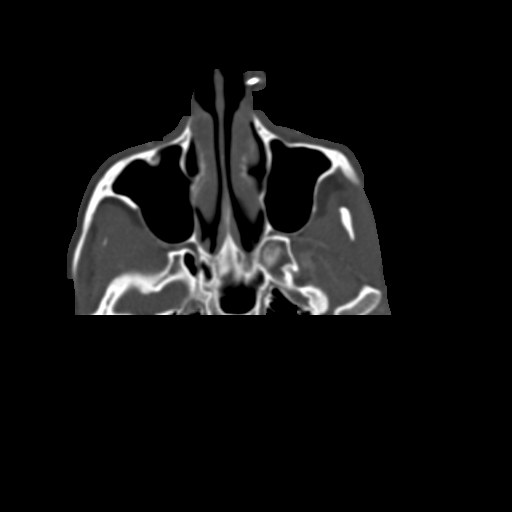

[Series 6: coronal · coronal · 0.39mm/px · 3 of 100 slices shown]
[im 36/100  bone]
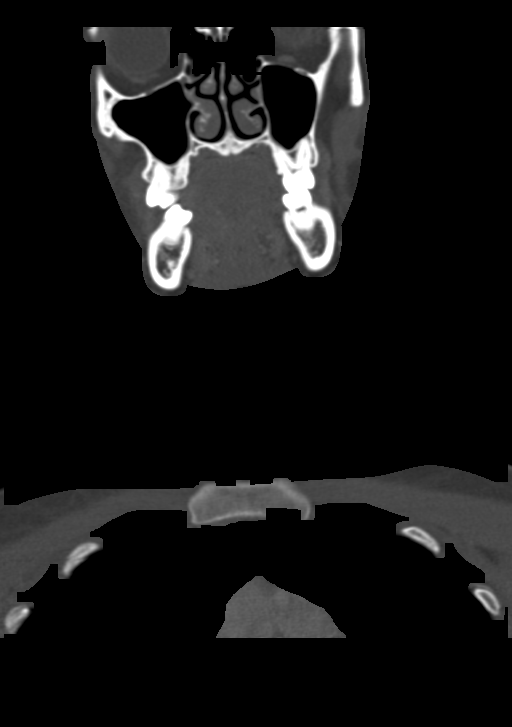
[im 45/100  bone]
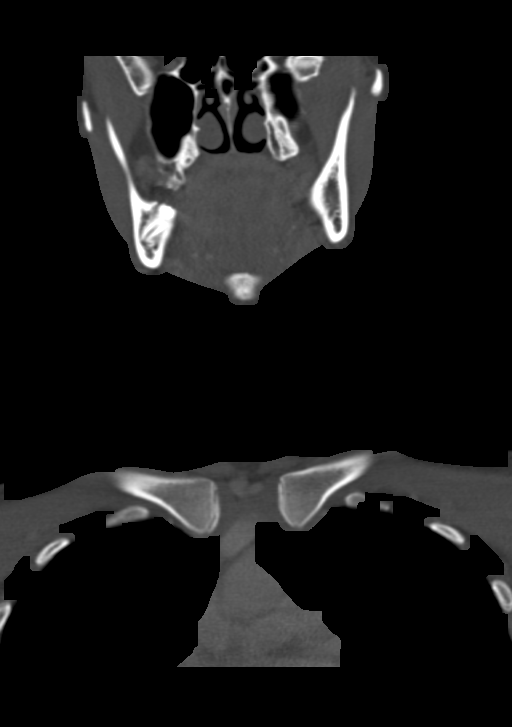
[im 55/100  bone]
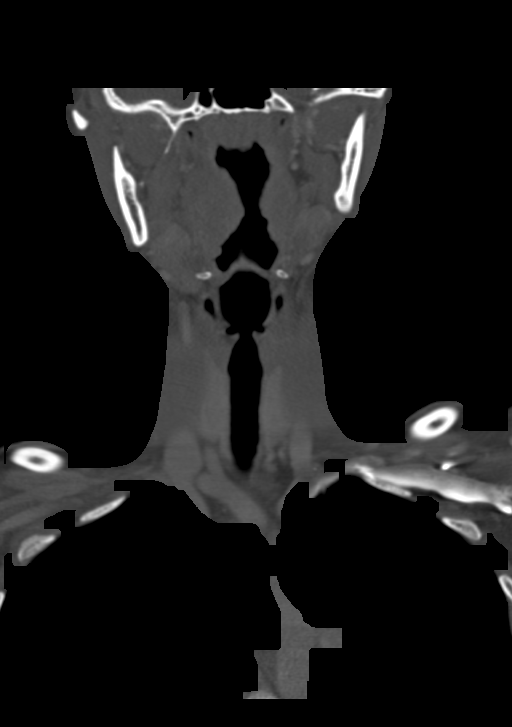

[Series 7: sagittal · sagittal · 0.39mm/px · 5 of 101 slices shown, 6 images]
[im 34/101  bone]
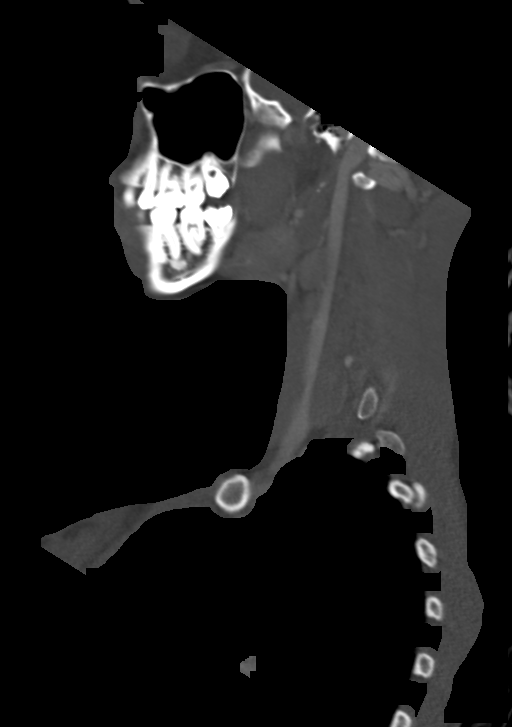
[im 42/101  bone]
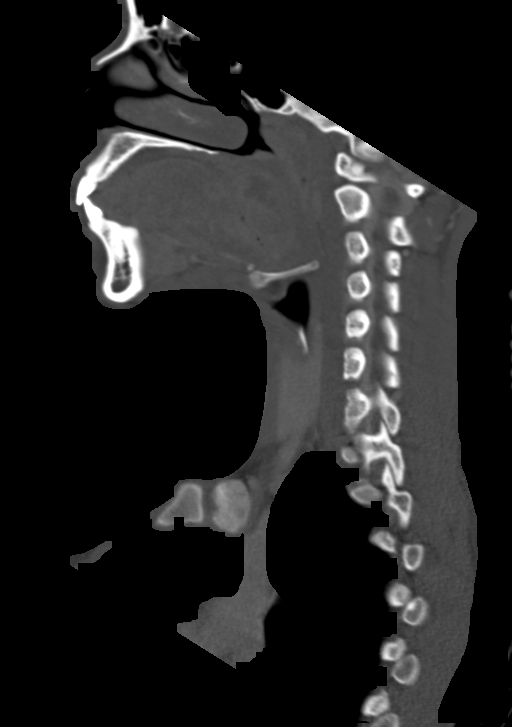
[im 51/101  soft-tissue]
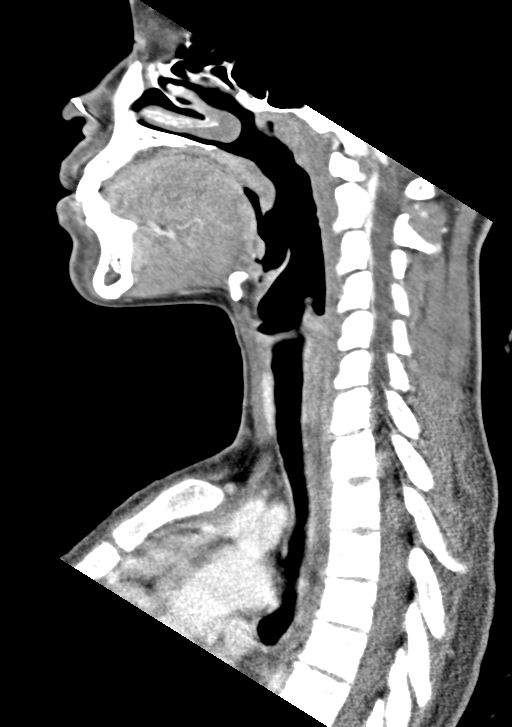
[im 51/101  bone]
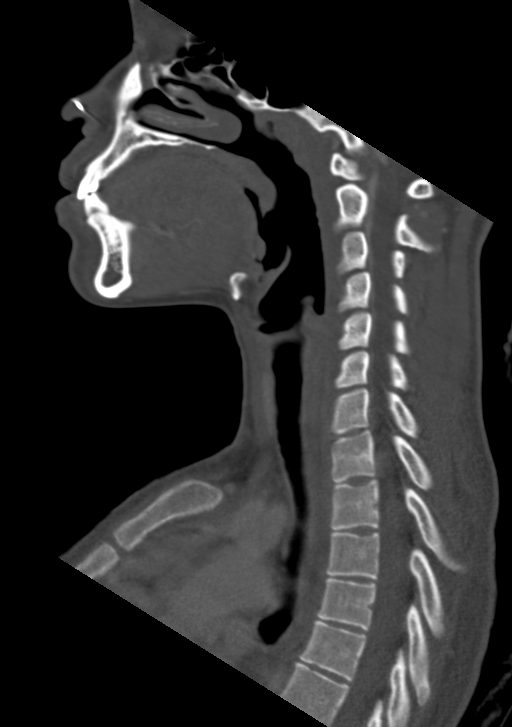
[im 59/101  bone]
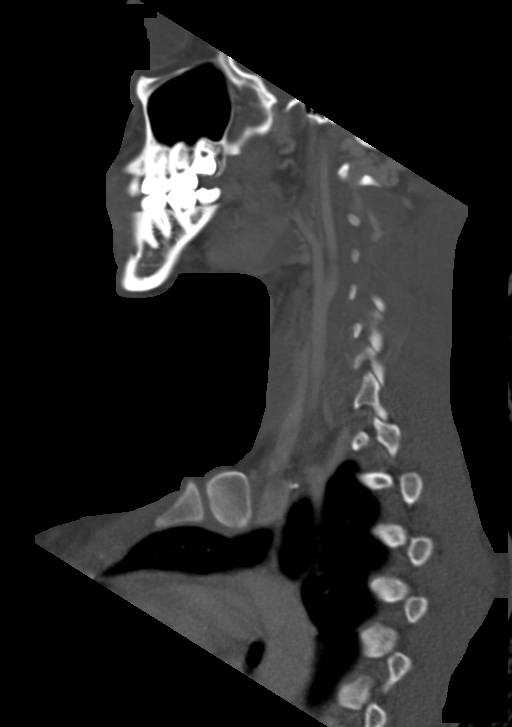
[im 67/101  bone]
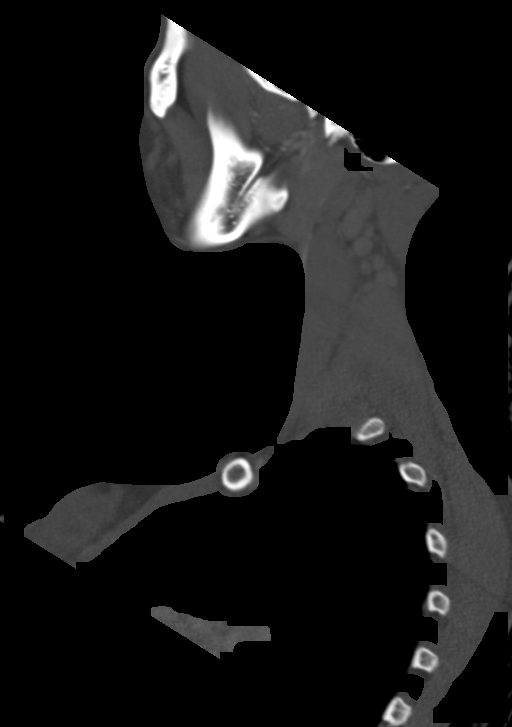

[12 of 33 positions shown; findings below may reference images not displayed]

RADIATION DOSE REDUCTION: This exam was performed according to the
departmental dose-optimization program which includes automated
exposure control, adjustment of the mA and/or kV according to
patient size and/or use of iterative reconstruction technique.

CONTRAST:  75mL OMNIPAQUE IOHEXOL 300 MG/ML  SOLN
FINDINGS: Pharynx and larynx: Enlargement of the right-greater-than-left
palatine tonsil, with a focal area of low-density measuring 1.4 x
0.7 x 1.2 cm (series 2, image 20 and series 6, image 52) in the
lateral aspect of the right tonsil,, likely phlegmon or developing
abscess. Additional less well-defined low-density area in the more
medial aspect of the tonsil (series 2, image 20 and series 6, image
[DATE] also represent developing phlegmon. The pharynx and larynx
are otherwise unremarkable.

Salivary glands: No inflammation, mass, or stone.

Thyroid: Normal.

Lymph nodes: Enlarged right level 2 lymph node measures up to 1.4 cm
in short axis (series 2, image 20), likely reactive. Additional
prominent right-greater-than-left level 2 lymph nodes, also likely
reactive.

Vascular: Negative.

Limited intracranial: Negative.

Visualized orbits: Negative.

Mastoids and visualized paranasal sinuses: Clear.

Skeleton: No acute or aggressive process.

Upper chest: Negative.

Other: None.
IMPRESSION: 1. Enlargement of the right-greater-than-left palatine tonsils, with
a focal area of low density, measuring up to 1.4 cm, concerning for
phlegmon or developing abscess. An additional more medial area of
less well-defined hypodensity may represent developing phlegmon.
2. Enlarged right-greater-than-left level 2 lymph nodes, likely
reactive.

These results were called by telephone at the time of interpretation
on 08/12/2021 at [DATE] to provider MIRLEIDE TROMBETTA , who verbally
acknowledged these results.
